# Patient Record
Sex: Male | Born: 1980 | Race: White | Hispanic: No | Marital: Married | Smoking: Current every day smoker
Health system: Southern US, Community
[De-identification: ages and names within clinical notes are randomized; demographics above are authoritative.]

## PROBLEM LIST (undated history)

## (undated) DIAGNOSIS — Z8659 Personal history of other mental and behavioral disorders: Secondary | ICD-10-CM

## (undated) DIAGNOSIS — Z720 Tobacco use: Principal | ICD-10-CM

## (undated) HISTORY — DX: Tobacco use: Z72.0

## (undated) HISTORY — PX: VASECTOMY: SHX75

## (undated) HISTORY — PX: KNEE SURGERY: SHX244

## (undated) HISTORY — DX: Personal history of other mental and behavioral disorders: Z86.59

---

## 2006-02-12 ENCOUNTER — Encounter: Admission: RE | Admit: 2006-02-12 | Discharge: 2006-02-12 | Payer: Self-pay | Admitting: Occupational Medicine

## 2011-12-04 ENCOUNTER — Ambulatory Visit (INDEPENDENT_AMBULATORY_CARE_PROVIDER_SITE_OTHER): Payer: BC Managed Care – PPO | Admitting: Family Medicine

## 2011-12-04 ENCOUNTER — Encounter: Payer: Self-pay | Admitting: Family Medicine

## 2011-12-04 VITALS — BP 141/82 | HR 77 | Ht 72.0 in | Wt 183.0 lb

## 2011-12-04 DIAGNOSIS — Z720 Tobacco use: Secondary | ICD-10-CM

## 2011-12-04 DIAGNOSIS — Z8659 Personal history of other mental and behavioral disorders: Secondary | ICD-10-CM

## 2011-12-04 DIAGNOSIS — F17200 Nicotine dependence, unspecified, uncomplicated: Secondary | ICD-10-CM

## 2011-12-04 HISTORY — DX: Personal history of other mental and behavioral disorders: Z86.59

## 2011-12-04 HISTORY — DX: Tobacco use: Z72.0

## 2011-12-04 MED ORDER — BUPROPION HCL ER (SR) 150 MG PO TB12: ORAL_TABLET | ORAL | Status: DC

## 2011-12-04 NOTE — Progress Notes (Signed)
CC: Douglas James is a 31 y.o. male is here for Establish Care and Nicotine Dependence   Subjective: HPI:  Very pleasant 31 year old male here to establish care  His only complaint is that he expresses a desire to quit smoking. Been smoking for matter of decades. Has not tried any interventions for quitting in the past other than cold [place] he states that these attempts were not very strongly motivated either. He's heard about Chantix does not consider other forms of smoking cessation. He feels that his current state of health is good and has no chronic medical conditions but does believe that he may have been depressed around the time of leaving the [employer] which coincided with his father's suicide. He denies any history of cardiac or lung disease. He denies any personal history of suicidal or homicidal thoughts.  He denies shortness of breath, chest pain, irregular heartbeat, orthopnea, peripheral edema, nor anxiety.     Review Of Systems Outlined In HPI  Past Medical History  Diagnosis Date  . Tobacco use 12/04/2011  . History of depression 12/04/2011     Family History  Problem Relation Age of Onset  . Depression Father   . Suicidality Father      History  Substance Use Topics  . Smoking status: Current Every Day Smoker -- 1.0 packs/day for 13 years    Types: Cigarettes  . Smokeless tobacco: Not on file  . Alcohol Use: No     Objective: Filed Vitals:   12/04/11 1006  BP: 141/82  Pulse: 77    General: Alert and Oriented, No Acute Distress HEENT: Pupils equal, round, reactive to light. Conjunctivae clear.  External ears unremarkable, canals clear with intact TMs with appropriate landmarks.  Middle ear appears open without effusion. Pink inferior turbinates.  Moist mucous membranes, pharynx without inflammation nor lesions.  Neck supple without palpable lymphadenopathy nor abnormal masses. Lungs: Clear to auscultation bilaterally, no wheezing/ronchi/rales.  Comfortable work of  breathing. Good air movement. Cardiac: Regular rate and rhythm. Normal S1/S2.  No murmurs, rubs, nor gallops.   Abdomen: Normal bowel sounds, soft and non tender without palpable masses. Mental Status: No depression, anxiety, nor agitation. Skin: Warm and dry.  Assessment & Plan: Douglas James was seen today for establish care and nicotine dependence.  Diagnoses and associated orders for this visit:  Tobacco use - buPROPion (WELLBUTRIN SR) 150 MG 12 hr tablet; Start regimen with one pill every morning for three days, thereafter take one pill twice a day, evening dose well before 6pm.  History of depression    Discussed cessation techniques including Chantix, Wellbutrin, taking replacement therapy, cold [place], behavioral interventions. After discussing risks and benefits of Chantix and Wellbutrin he was somewhat fearful of the vivid dream Association with Chantix and is aware of the risk of suicidality and depression with the use of either one of these medications. He lives at home with his wife and kids and will use them as a support group will notify me as soon as possible if his mood deteriorates after starting Wellbutrin.  Return in 2 weeks after starting Wellbutrin to assess his response. We discussed choosing a concrete quit date and starting Wellbutrin  5-7 days beforehand.  Return in about 2 weeks (around 12/18/2011).  Requested Prescriptions   Signed Prescriptions Disp Refills  . buPROPion (WELLBUTRIN SR) 150 MG 12 hr tablet 60 tablet 2    Sig: Start regimen with one pill every morning for three days, thereafter take one pill twice a day, evening  dose well before 6pm.

## 2012-04-09 ENCOUNTER — Ambulatory Visit (INDEPENDENT_AMBULATORY_CARE_PROVIDER_SITE_OTHER): Payer: BC Managed Care – PPO | Admitting: Family Medicine

## 2012-04-09 ENCOUNTER — Ambulatory Visit (INDEPENDENT_AMBULATORY_CARE_PROVIDER_SITE_OTHER): Payer: BC Managed Care – PPO

## 2012-04-09 ENCOUNTER — Encounter: Payer: Self-pay | Admitting: Family Medicine

## 2012-04-09 VITALS — BP 136/86 | HR 82 | Ht 72.0 in | Wt 185.0 lb

## 2012-04-09 DIAGNOSIS — S39012A Strain of muscle, fascia and tendon of lower back, initial encounter: Secondary | ICD-10-CM

## 2012-04-09 DIAGNOSIS — M899 Disorder of bone, unspecified: Secondary | ICD-10-CM

## 2012-04-09 DIAGNOSIS — M898X1 Other specified disorders of bone, shoulder: Secondary | ICD-10-CM

## 2012-04-09 DIAGNOSIS — W11XXXA Fall on and from ladder, initial encounter: Secondary | ICD-10-CM

## 2012-04-09 DIAGNOSIS — M25519 Pain in unspecified shoulder: Secondary | ICD-10-CM

## 2012-04-09 DIAGNOSIS — M549 Dorsalgia, unspecified: Secondary | ICD-10-CM

## 2012-04-09 DIAGNOSIS — M546 Pain in thoracic spine: Secondary | ICD-10-CM

## 2012-04-09 DIAGNOSIS — M949 Disorder of cartilage, unspecified: Secondary | ICD-10-CM

## 2012-04-09 MED ORDER — IBUPROFEN 800 MG PO TABS: 800.0000 mg | ORAL_TABLET | Freq: Three times a day (TID) | ORAL | Status: DC | PRN
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
## 2012-06-05 ENCOUNTER — Encounter: Payer: Self-pay | Admitting: Family Medicine

## 2012-06-05 ENCOUNTER — Ambulatory Visit (INDEPENDENT_AMBULATORY_CARE_PROVIDER_SITE_OTHER): Payer: BC Managed Care – PPO | Admitting: Family Medicine

## 2012-06-05 VITALS — BP 138/82 | HR 99 | Wt 187.0 lb

## 2012-06-05 DIAGNOSIS — M542 Cervicalgia: Secondary | ICD-10-CM

## 2012-06-05 MED ORDER — MELOXICAM 15 MG PO TABS: 15.0000 mg | ORAL_TABLET | Freq: Every day | ORAL | Status: DC

## 2012-06-05 NOTE — Patient Instructions (Signed)
Mobic daily and may supplement with hydrocodone (left over) every six hours as needed.  Cyclobenzaprine (muscle relaxer) every 8 hours only as needed.

## 2012-06-05 NOTE — Progress Notes (Signed)
CC: Douglas James is a 32 y.o. male is here for Back Pain   Subjective: HPI:  Patient complains of acute onset left neck and shoulder pain. This occurred on Tuesday while brushing his teeth. He came on abruptly and has slightly gotten worse over Wednesday. It is described as moderate to severe sharpness and tightness localized just lateral to the left neck, symptoms are worsened with any movement of the neck. Symptoms are slightly improved with lying flat or hydrocodone use. No response to high dose Tylenol.  When pain is at worst it radiates down the left arm but is hard to describe. Denies exertional chest or arm discomfort. Has never had this type of pain before. Denies recent or remote trauma. Denies weakness, motor sensory disturbances, rash, arm tingling/numbness, midline back pain, dysphagia, hearing complaints, upper respiratory complaints, fevers, chills.    Review Of Systems Outlined In HPI  Past Medical History  Diagnosis Date  . Tobacco use 12/04/2011  . History of depression 12/04/2011     Family History  Problem Relation Age of Onset  . Depression Father   . Suicidality Father      History  Substance Use Topics  . Smoking status: Current Every Day Smoker -- 1.00 packs/day for 13 years    Types: Cigarettes  . Smokeless tobacco: Not on file  . Alcohol Use: No     Objective: Filed Vitals:   06/05/12 1105  BP: 138/82  Pulse: 99    General: Alert and Oriented, No Acute Distress HEENT: Pupils equal, round, reactive to light. Conjunctivae clear.  External ears unremarkable, canals clear with intact TMs with appropriate landmarks.  Middle ear appears open without effusion. Pink inferior turbinates.  Moist mucous membranes, pharynx without inflammation nor lesions.  Neck supple without palpable lymphadenopathy nor abnormal masses. Lungs: Clear to auscultation bilaterally, no wheezing/ronchi/rales.  Comfortable work of breathing. Good air movement. Cardiac: Regular rate and  rhythm.  Extremities: No peripheral edema.  Strong peripheral pulses.  MSK: Spurling negative bilaterally, Hawkins negative, speed's negative, crossarm test negative, empty can test negative, however all tests above cause some discomfort in the left upper trapezius.  Pain is reproduced with palpation of left trapezius upper muscle belly or any active resisted movement of the neck, no midline cervical spine tenderness  Neuro: CN II-XII grossly intact, full strength/rom of all four extremities, C DTRs 2/4 bilaterally, gait normal, rapid alternating movements normal, Mental Status: No depression, anxiety, nor agitation. Skin: Warm and dry.  Assessment & Plan: Douglas James was seen today for back pain.  Diagnoses and associated orders for this visit:  Neck pain - meloxicam (MOBIC) 15 MG tablet; Take 1 tablet (15 mg total) by mouth daily.    Neck pain: Discussed with patient that my working diagnosis is most likely left trapezius muscle strain/sprain from unknown etiology/precipitating event. Given the uncertainty and firm diagnosis and similar presentation in the thoracic spine a few months ago we'll refer to Dr. Karie Schwalbe. sports medicine for consultation. Reassurance given no red flags at this time  Return in about 1 day (around 06/06/2012).

## 2012-06-09 ENCOUNTER — Ambulatory Visit: Payer: BC Managed Care – PPO | Admitting: Sports Medicine

## 2012-08-04 ENCOUNTER — Ambulatory Visit (INDEPENDENT_AMBULATORY_CARE_PROVIDER_SITE_OTHER): Payer: BC Managed Care – PPO | Admitting: Family Medicine

## 2012-08-04 ENCOUNTER — Encounter: Payer: Self-pay | Admitting: Family Medicine

## 2012-08-04 VITALS — BP 134/83 | HR 78 | Wt 179.0 lb

## 2012-08-04 DIAGNOSIS — F419 Anxiety disorder, unspecified: Secondary | ICD-10-CM | POA: Insufficient documentation

## 2012-08-04 DIAGNOSIS — F411 Generalized anxiety disorder: Secondary | ICD-10-CM

## 2012-08-04 MED ORDER — BUSPIRONE HCL 7.5 MG PO TABS: ORAL_TABLET | ORAL | Status: DC

## 2012-08-04 NOTE — Progress Notes (Signed)
CC: Douglas James is a 32 y.o. male is here for discuss possible anxiety   Subjective: HPI:  Patient complains of anxiety and irritability that has been present for years but has worsened over the last 2-3 months for no particular reason. Described as moderate to severe worsened when having to work with frustrating situations at home or at work. Wife believes it could potentially get in the way the relationship at home.  Nothing else seems to make better or worse. Has a history of depression but denies subjective depression, loss of hobbies, sleeping disturbance, unintentional weight gain or loss, thoughts of wanting to harm self or others.  Denies mental disturbance otherwise, paranoia, hallucinations, manic-like episodes. Denies fevers, chills, shortness of breath, tremor, motor or sensory disturbances   Review Of Systems Outlined In HPI  Past Medical History  Diagnosis Date  . Tobacco use 12/04/2011  . History of depression 12/04/2011     Family History  Problem Relation Age of Onset  . Depression Father   . Suicidality Father      History  Substance Use Topics  . Smoking status: Current Every Day Smoker -- 1.00 packs/day for 13 years    Types: Cigarettes  . Smokeless tobacco: Not on file  . Alcohol Use: No     Objective: Filed Vitals:   08/04/12 0903  BP: 134/83  Pulse: 78    Vital signs reviewed. General: Alert and Oriented, No Acute Distress HEENT: Pupils equal, round, reactive to light. Conjunctivae clear.  External ears unremarkable.  Moist mucous membranes. Lungs: Clear and comfortable work of breathing, speaking in full sentences without accessory muscle use. Cardiac: Regular rate and rhythm.  Neuro: CN II-XII grossly intact, gait normal. Extremities: No peripheral edema.  Strong peripheral pulses.  Mental Status: No depression, anxiety, nor agitation. Logical though process. Skin: Warm and dry.  Assessment & Plan: Douglas James was seen today for discuss possible  anxiety.  Diagnoses and associated orders for this visit:  Anxiety - busPIRone (BUSPAR) 7.5 MG tablet; One by mouth daily for five days then twice a day daily thereafter.    Anxiety: Uncontrolled, discussed medication options, he is weary of potential sexual side effects of Celexa therefore will try BuSpar first   Return in about 4 weeks (around 09/01/2012).

## 2012-09-16 ENCOUNTER — Encounter: Payer: Self-pay | Admitting: Family Medicine

## 2012-09-16 ENCOUNTER — Ambulatory Visit (INDEPENDENT_AMBULATORY_CARE_PROVIDER_SITE_OTHER): Payer: BC Managed Care – PPO | Admitting: Family Medicine

## 2012-09-16 VITALS — BP 145/81 | HR 84 | Wt 179.0 lb

## 2012-09-16 DIAGNOSIS — M25519 Pain in unspecified shoulder: Secondary | ICD-10-CM

## 2012-09-16 DIAGNOSIS — Z720 Tobacco use: Secondary | ICD-10-CM

## 2012-09-16 DIAGNOSIS — F411 Generalized anxiety disorder: Secondary | ICD-10-CM

## 2012-09-16 DIAGNOSIS — F419 Anxiety disorder, unspecified: Secondary | ICD-10-CM

## 2012-09-16 DIAGNOSIS — M25512 Pain in left shoulder: Secondary | ICD-10-CM

## 2012-09-16 MED ORDER — MELOXICAM 15 MG PO TABS: 15.0000 mg | ORAL_TABLET | Freq: Every day | ORAL | Status: AC

## 2012-09-16 MED ORDER — BUSPIRONE HCL 7.5 MG PO TABS: ORAL_TABLET | ORAL | Status: AC

## 2012-09-16 NOTE — Progress Notes (Signed)
CC: Douglas James is a 32 y.o. male is here for left shoulder pain   Subjective: HPI:  Patient complains of left shoulder pain has been present for 2-3 weeks it occurred abruptly while doing repetitive hand stands on a diving board. Pain was severe enough for him to stop what he was doing. Describes it as a sharp pain moderate in severity left shoulder radiates in the biceps and triceps. It is worse with abduction external rotation, even worse when bringing back into a neutral position as he describes a catch With a sharp pain that takes his breath away.  No interventions as of yet. He denies weakness in the left upper extremity but does report guarding. Pain is reproduced with elevation of the head or bringing his arm from behind his low back forward.  He denies swelling, redness or warmth at any point since the injury. Denies motor or sensory disturbances in the distal extremity. Denies a history of dislocation. Denies chest pain, shortness of breath, neck pain.  He reports subjective improvement and satisfaction with anxiety since starting BuSpar requesting refill  Review Of Systems Outlined In HPI  Past Medical History  Diagnosis Date  . Tobacco use 12/04/2011  . History of depression 12/04/2011     Family History  Problem Relation Age of Onset  . Depression Father   . Suicidality Father      History  Substance Use Topics  . Smoking status: Current Every Day Smoker -- 1.00 packs/day for 13 years    Types: Cigarettes  . Smokeless tobacco: Not on file  . Alcohol Use: No     Objective: Filed Vitals:   09/16/12 0906  BP: 145/81  Pulse: 84    General: Alert and Oriented, No Acute Distress HEENT: Pupils equal, round, reactive to light. Conjunctivae clear.   moist mucous membranes  Lungs:  clear and comfortable work of breathing  Cardiac:Regular rate and rhythm  Extremities: No peripheral edema.  Strong peripheral pulses. Left upper extremity exam reveals negative empty can,  negative Hawkin, negative crossarm, positive O'Brien, no a.c. joint tenderness to palpation , apprehension test positive, speed's negative, no pain with resisted supination . C5-C6 DTRs two over four bilaterally  Mental Status: No depression, anxiety, nor agitation. Skin: Warm and dry.  Assessment & Plan: Douglas James was seen today for left shoulder pain.  Diagnoses and associated orders for this visit:  Tobacco use  Left shoulder pain - meloxicam (MOBIC) 15 MG tablet; Take 1 tablet (15 mg total) by mouth daily.  Anxiety - busPIRone (BUSPAR) 7.5 MG tablet; One twice a day daily thereafter.    Left shoulder pain: I am suspicion of a labral tear very low suspicion for bone pathology therefore will hold off on x-ray at this time. I've given him rehabilitation exercises using the rotator cuff handout along with elastic band to be performed daily for 3 weeks using meloxicam for pain. If not improved in 3 weeks followup with Dr. Karie James. in sports medicine for referral Anxiety: Controlled chronic condition continue BuSpar  Return in about 3 weeks (around 10/07/2012) for Dr. Karie James Sports Med.

## 2013-08-05 ENCOUNTER — Telehealth: Payer: Self-pay | Admitting: *Deleted

## 2013-08-05 MED ORDER — VARENICLINE TARTRATE 0.5 MG X 11 & 1 MG X 42 PO MISC: ORAL | Status: DC

## 2013-08-05 NOTE — Telephone Encounter (Signed)
Pt is requesting a rx for chantix. Ok per Dr. Ivan [email or web address]  Message on vm for pt

## 2014-04-30 ENCOUNTER — Encounter: Payer: Self-pay | Admitting: Family Medicine

## 2014-04-30 ENCOUNTER — Ambulatory Visit (INDEPENDENT_AMBULATORY_CARE_PROVIDER_SITE_OTHER): Payer: BLUE CROSS/BLUE SHIELD | Admitting: Family Medicine

## 2014-04-30 VITALS — BP 144/92 | HR 95 | Temp 97.8°F | Wt 190.0 lb

## 2014-04-30 DIAGNOSIS — R509 Fever, unspecified: Secondary | ICD-10-CM | POA: Diagnosis not present

## 2014-04-30 MED ORDER — MELOXICAM 15 MG PO TABS: 15.0000 mg | ORAL_TABLET | Freq: Every day | ORAL | Status: AC

## 2014-04-30 MED ORDER — AZITHROMYCIN 250 MG PO TABS: ORAL_TABLET | ORAL | Status: AC

## 2014-04-30 NOTE — Progress Notes (Signed)
CC: Douglas James is a 34 y.o. male is here for URI   Subjective: HPI:  Complains of diffuse body aches, subjective chills, fatigue, and nonproductive cough that has been present for the past 18 hours which came on abruptly and is moderate to severe in severity. Fever has been as high as 101.0 this morning. No interventions as of yet other than trying to get warm by the fireplace without any success. Was dealing with nasal congestion and facial pressure with a productive cough in the middle of last month however this was improved with an antibiotic and prednisone. Symptoms now are persisted all hours of the day. Denies confusion, motor or sensory disturbances other than that described above, nausea, vomiting, back pain, chest pain, wheezing shortness of breath or headache.    Review Of Systems Outlined In HPI  Past Medical History  Diagnosis Date  . Tobacco use 12/04/2011  . History of depression 12/04/2011    Past Surgical History  Procedure Laterality Date  . Knee surgery      right knee  . Vasectomy     Family History  Problem Relation Age of Onset  . Depression Father   . Suicidality Father     History   Social History  . Marital Status: Married    Spouse Name: N/A  . Number of Children: N/A  . Years of Education: N/A   Occupational History  . Not on file.   Social History Main Topics  . Smoking status: Current Every Day Smoker -- 1.00 packs/day for 13 years    Types: Cigarettes  . Smokeless tobacco: Not on file  . Alcohol Use: No  . Drug Use: No  . Sexual Activity: Yes   Other Topics Concern  . Not on file   Social History Narrative     Objective: BP 144/92 mmHg  Pulse 95  Temp(Src) 97.8 F (36.6 C) (Oral)  Wt 190 lb (86.183 kg)  General: Alert and Oriented, No Acute Distress HEENT: Pupils equal, round, reactive to light. Conjunctivae clear.  External ears unremarkable, canals clear with intact TMs with appropriate landmarks.  Middle ear appears open  without effusion. Pink inferior turbinates.  Moist mucous membranes, pharynx without inflammation nor lesions.  Neck supple without palpable lymphadenopathy nor abnormal masses. Lungs: Clear to auscultation bilaterally, no wheezing/ronchi/rales.  Comfortable work of breathing. Good air movement. Cardiac: Regular rate and rhythm. Normal S1/S2.  No murmurs, rubs, nor gallops.   Extremities: No peripheral edema.  Strong peripheral pulses.  Mental Status: No depression, anxiety, nor agitation. Skin: Warm and dry.  Assessment & Plan: Douglas James was seen today for uri.  Diagnoses and all orders for this visit:  Fever, unspecified fever cause Orders: -     azithromycin (ZITHROMAX) 250 MG tablet; Take two tabs at once on day 1, then one tab daily on days 2-5. -     meloxicam (MOBIC) 15 MG tablet; Take 1 tablet (15 mg total) by mouth daily.   Reassurance provided that this is most likely a viral illness, fortunately flu test was negative. Discussed symptomatic care and consider starting azithromycin if fever persists into the weekend or any decline of the presenting symptoms.Signs and symptoms requring emergent/urgent reevaluation were discussed with the patient.  Return if symptoms worsen or fail to improve.

## 2014-06-08 IMAGING — CR DG SCAPULA*L*
3 series · 3 of 3 positions shown · non-contrast
Comparison: None.

CLINICAL DATA: Fall

LEFT SCAPULA - 2+ VIEWS

[view not recorded (1 of 3)]
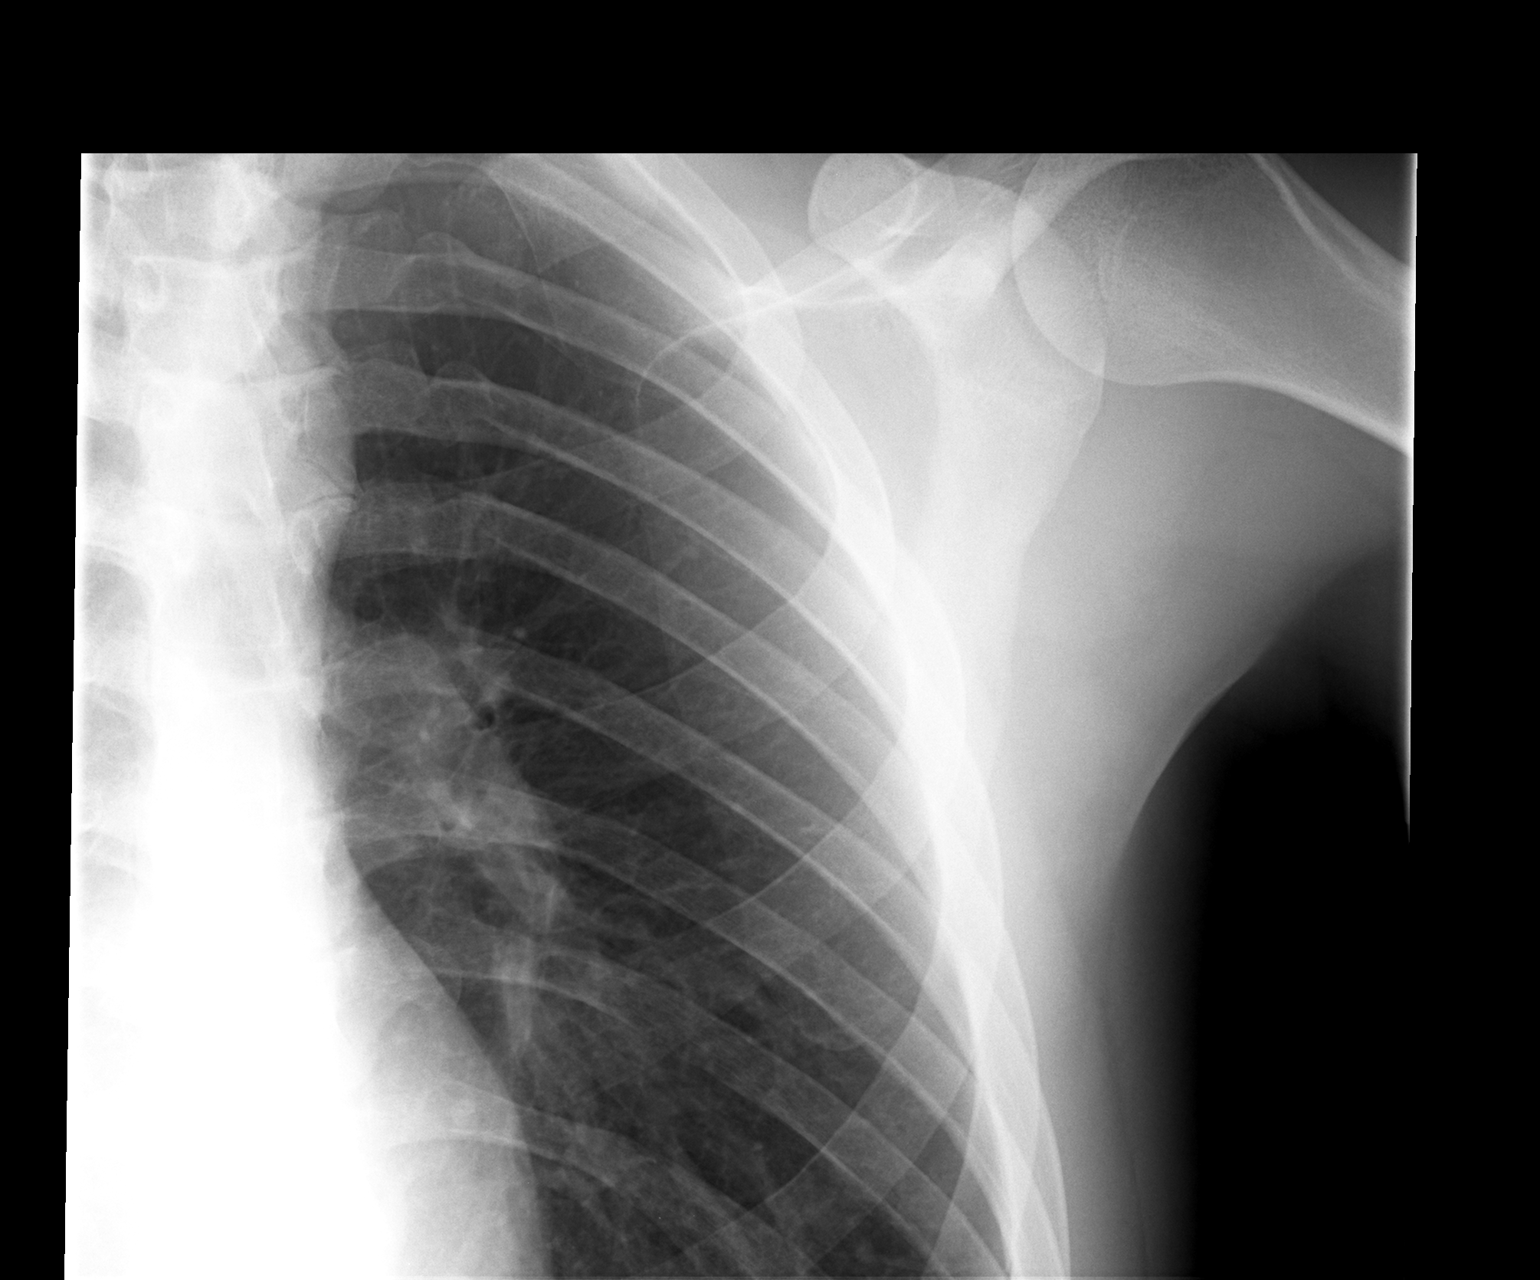

[view not recorded (2 of 3)]
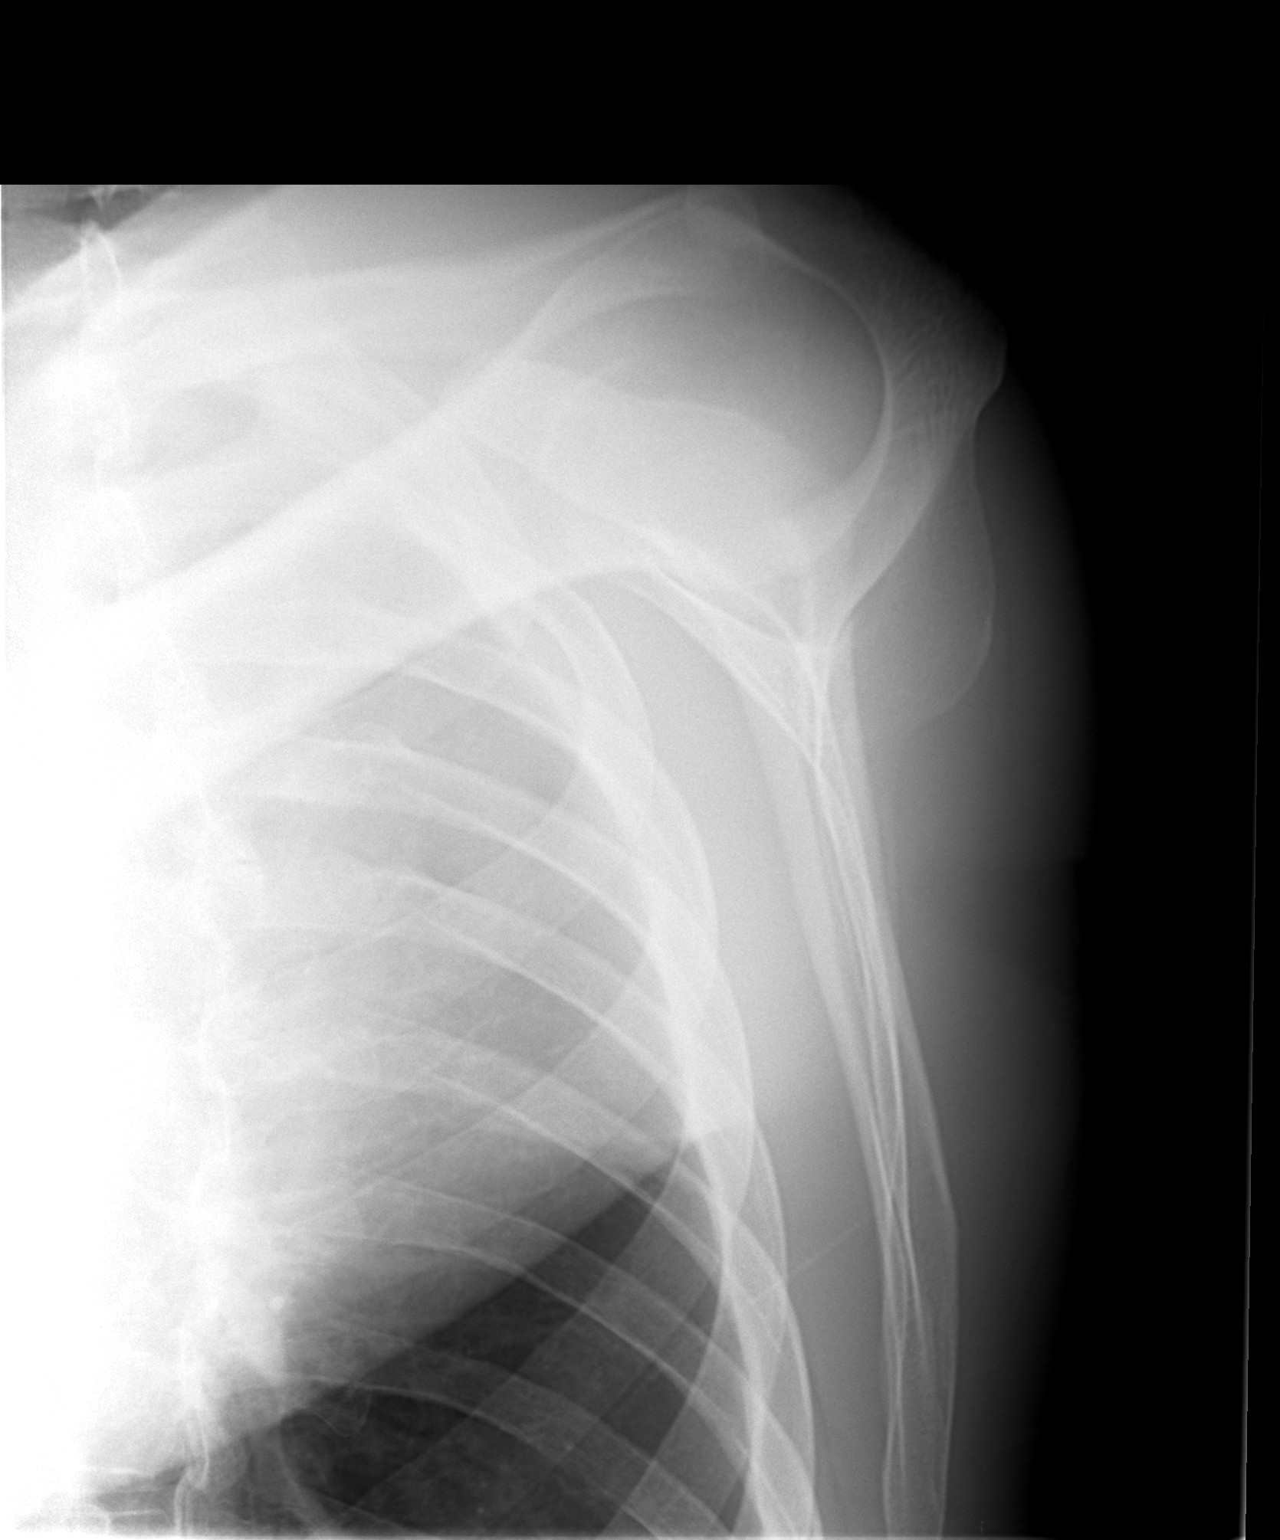

[view not recorded (3 of 3)]
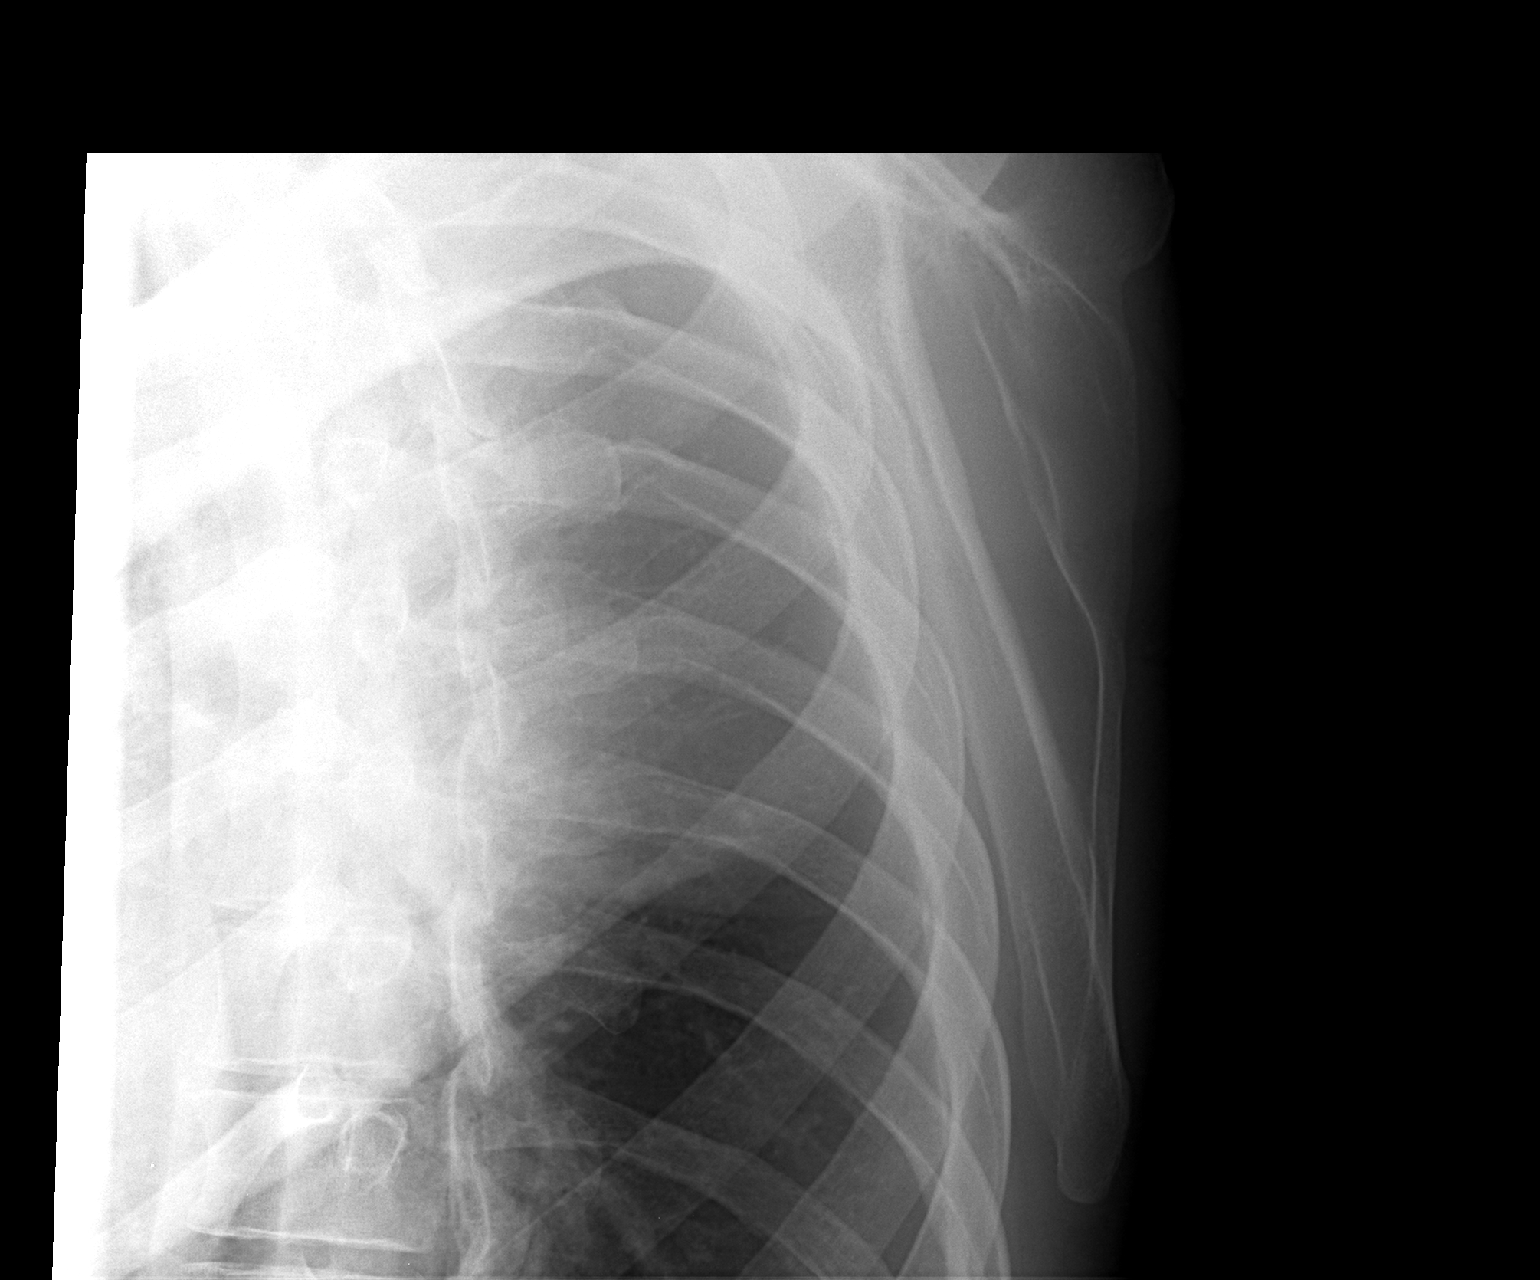

[3 of 3 positions shown; findings below may reference images not displayed]

FINDINGS: Three views of the left scapula submitted.  No definite
fracture or subluxation.
IMPRESSION: No definite fracture or subluxation.

## 2022-10-16 ENCOUNTER — Emergency Department (HOSPITAL_COMMUNITY)
Admission: EM | Admit: 2022-10-16 | Discharge: 2022-10-16 | Disposition: A | Discharge status: Home / Self Care | Payer: Worker's Compensation | Attending: Emergency Medicine | Admitting: Emergency Medicine

## 2022-10-16 ENCOUNTER — Other Ambulatory Visit: Payer: Self-pay

## 2022-10-16 ENCOUNTER — Emergency Department (HOSPITAL_COMMUNITY): Payer: Worker's Compensation

## 2022-10-16 DIAGNOSIS — T5991XA Toxic effect of unspecified gases, fumes and vapors, accidental (unintentional), initial encounter: Secondary | ICD-10-CM

## 2022-10-16 DIAGNOSIS — R42 Dizziness and giddiness: Secondary | ICD-10-CM | POA: Diagnosis not present

## 2022-10-16 DIAGNOSIS — T59891A Toxic effect of other specified gases, fumes and vapors, accidental (unintentional), initial encounter: Secondary | ICD-10-CM | POA: Diagnosis not present

## 2022-10-16 DIAGNOSIS — J029 Acute pharyngitis, unspecified: Secondary | ICD-10-CM | POA: Diagnosis present

## 2022-10-16 DIAGNOSIS — Z87891 Personal history of nicotine dependence: Secondary | ICD-10-CM | POA: Insufficient documentation

## 2022-10-16 LAB — I-STAT CG4 LACTIC ACID, ED: Lactic Acid, Venous: <0.3 mmol/L — ABNORMAL LOW (ref 0.5–1.9)

## 2022-10-16 LAB — COMPREHENSIVE METABOLIC PANEL
ALT: 13 U/L (ref 0–44)
AST: 31 U/L (ref 15–41)
Albumin: 4.4 g/dL (ref 3.5–5.0)
Alkaline Phosphatase: 57 U/L (ref 38–126)
Anion gap: 9 (ref 5–15)
BUN: 11 mg/dL (ref 6–20)
CO2: 23 mmol/L (ref 22–32)
Calcium: 8.9 mg/dL (ref 8.9–10.3)
Chloride: 105 mmol/L (ref 98–111)
Creatinine, Ser: 1.13 mg/dL (ref 0.61–1.24)
GFR, Estimated: >60 mL/min (ref >60)
Glucose, Bld: 90 mg/dL (ref 70–99)
Potassium: 3.7 mmol/L (ref 3.5–5.1)
Sodium: 137 mmol/L (ref 135–145)
Total Bilirubin: 0.6 mg/dL (ref 0.3–1.2)
Total Protein: 7.3 g/dL (ref 6.5–8.1)

## 2022-10-16 LAB — CBC WITH DIFFERENTIAL/PLATELET
Abs Immature Granulocytes: 0.05 10*3/uL (ref 0.00–0.07)
Basophils Absolute: 0 10*3/uL (ref 0.0–0.1)
Basophils Relative: 0 %
Eosinophils Absolute: 0.1 10*3/uL (ref 0.0–0.5)
Eosinophils Relative: 1 %
HCT: 45.8 % (ref 39.0–52.0)
Hemoglobin: 15.7 g/dL (ref 13.0–17.0)
Immature Granulocytes: 0 %
Lymphocytes Relative: 25 %
Lymphs Abs: 2.8 10*3/uL (ref 0.7–4.0)
MCH: 30.3 pg (ref 26.0–34.0)
MCHC: 34.3 g/dL (ref 30.0–36.0)
MCV: 88.4 fL (ref 80.0–100.0)
Monocytes Absolute: 0.6 10*3/uL (ref 0.1–1.0)
Monocytes Relative: 5 %
Neutro Abs: 7.7 10*3/uL (ref 1.7–7.7)
Neutrophils Relative %: 69 %
Platelets: 211 10*3/uL (ref 150–400)
RBC: 5.18 MIL/uL (ref 4.22–5.81)
RDW: 12.8 % (ref 11.5–15.5)
WBC: 11.2 10*3/uL — ABNORMAL HIGH (ref 4.0–10.5)
nRBC: 0 % (ref 0.0–0.2)

## 2022-10-16 MED ORDER — DIPHENHYDRAMINE HCL 50 MG/ML IJ SOLN
25.0000 mg | Freq: Once | INTRAMUSCULAR | Status: AC
  Administered 2022-10-16: 25 mg via INTRAVENOUS
  Filled 2022-10-16: qty 1

## 2022-10-16 MED ORDER — PROCHLORPERAZINE EDISYLATE 10 MG/2ML IJ SOLN
10.0000 mg | Freq: Once | INTRAMUSCULAR | Status: AC
  Administered 2022-10-16: 10 mg via INTRAVENOUS
  Filled 2022-10-16: qty 2

## 2022-10-16 MED ORDER — KETOROLAC TROMETHAMINE 30 MG/ML IJ SOLN: 30.0000 mg | Freq: Once | INTRAMUSCULAR | Status: DC

## 2022-10-16 NOTE — ED Notes (Signed)
Patient conversing with staff in complete sentences without any issues. Patient continues to clear throat. Non-productive cough noted.
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
## 2022-10-16 NOTE — ED Triage Notes (Signed)
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 130/90, 90, CO2 1,
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
